# Patient Record
Sex: Female | Born: 1990 | Race: White | Hispanic: No | Marital: Married
Health system: Southern US, Community
[De-identification: ages and names within clinical notes are randomized; demographics above are authoritative.]

---

## 2020-05-02 ENCOUNTER — Telehealth (HOSPITAL_COMMUNITY): Payer: Self-pay

## 2020-05-02 NOTE — Telephone Encounter (Signed)
RN called patient after receiving a referral for the monoclonal antibodies for the treatment of COVID. Discussed with patient that due to low medication supply and high patient volumes we are currently prioritizing [place] patients and those living in a [place], [place] or [place] counties. ° °RN provided the phone number for [place] [place] [facility] for MAB clinics [phone], and website [email or web address] for additional MAB treatment centers near patient.  °

## 2021-11-02 ENCOUNTER — Other Ambulatory Visit: Payer: Self-pay | Admitting: Family Medicine

## 2021-11-02 DIAGNOSIS — N926 Irregular menstruation, unspecified: Secondary | ICD-10-CM

## 2021-11-08 ENCOUNTER — Ambulatory Visit
Admission: RE | Admit: 2021-11-08 | Discharge: 2021-11-08 | Disposition: A | Discharge status: Home / Self Care | Payer: Managed Care, Other (non HMO) | Source: Ambulatory Visit | Attending: Family Medicine | Admitting: Family Medicine

## 2021-11-08 DIAGNOSIS — N926 Irregular menstruation, unspecified: Secondary | ICD-10-CM

## 2022-01-27 DIAGNOSIS — M25562 Pain in left knee: Secondary | ICD-10-CM | POA: Diagnosis not present

## 2022-01-27 DIAGNOSIS — M7150 Other bursitis, not elsewhere classified, unspecified site: Secondary | ICD-10-CM | POA: Diagnosis not present

## 2022-01-27 DIAGNOSIS — M7989 Other specified soft tissue disorders: Secondary | ICD-10-CM | POA: Diagnosis not present

## 2022-01-27 DIAGNOSIS — W19XXXA Unspecified fall, initial encounter: Secondary | ICD-10-CM | POA: Diagnosis not present

## 2022-01-28 DIAGNOSIS — W19XXXA Unspecified fall, initial encounter: Secondary | ICD-10-CM | POA: Diagnosis not present

## 2022-01-28 DIAGNOSIS — M25562 Pain in left knee: Secondary | ICD-10-CM | POA: Diagnosis not present

## 2022-02-27 DIAGNOSIS — Z6836 Body mass index (BMI) 36.0-36.9, adult: Secondary | ICD-10-CM | POA: Diagnosis not present

## 2022-02-27 DIAGNOSIS — N926 Irregular menstruation, unspecified: Secondary | ICD-10-CM | POA: Diagnosis not present

## 2022-05-22 DIAGNOSIS — J069 Acute upper respiratory infection, unspecified: Secondary | ICD-10-CM | POA: Diagnosis not present

## 2022-05-22 DIAGNOSIS — J349 Unspecified disorder of nose and nasal sinuses: Secondary | ICD-10-CM | POA: Diagnosis not present

## 2022-08-06 DIAGNOSIS — L03112 Cellulitis of left axilla: Secondary | ICD-10-CM | POA: Diagnosis not present

## 2022-10-15 DIAGNOSIS — R070 Pain in throat: Secondary | ICD-10-CM | POA: Diagnosis not present

## 2022-10-15 DIAGNOSIS — R0981 Nasal congestion: Secondary | ICD-10-CM | POA: Diagnosis not present

## 2023-02-18 DIAGNOSIS — R350 Frequency of micturition: Secondary | ICD-10-CM | POA: Diagnosis not present

## 2023-02-18 DIAGNOSIS — R300 Dysuria: Secondary | ICD-10-CM | POA: Diagnosis not present

## 2023-03-03 DIAGNOSIS — Z6835 Body mass index (BMI) 35.0-35.9, adult: Secondary | ICD-10-CM | POA: Diagnosis not present

## 2023-03-03 DIAGNOSIS — N3000 Acute cystitis without hematuria: Secondary | ICD-10-CM | POA: Diagnosis not present

## 2023-03-18 DIAGNOSIS — N3000 Acute cystitis without hematuria: Secondary | ICD-10-CM | POA: Diagnosis not present

## 2023-03-31 DIAGNOSIS — Z2821 Immunization not carried out because of patient refusal: Secondary | ICD-10-CM | POA: Diagnosis not present

## 2023-03-31 DIAGNOSIS — Z1331 Encounter for screening for depression: Secondary | ICD-10-CM | POA: Diagnosis not present

## 2023-03-31 DIAGNOSIS — R300 Dysuria: Secondary | ICD-10-CM | POA: Diagnosis not present

## 2023-03-31 DIAGNOSIS — Z1339 Encounter for screening examination for other mental health and behavioral disorders: Secondary | ICD-10-CM | POA: Diagnosis not present

## 2023-03-31 DIAGNOSIS — N3090 Cystitis, unspecified without hematuria: Secondary | ICD-10-CM | POA: Diagnosis not present

## 2024-01-29 IMAGING — US US PELVIS COMPLETE WITH TRANSVAGINAL
1 series · 13 of 25 positions shown · non-contrast
Comparison: Abdominopelvic CT 01/02/2020

CLINICAL DATA: Abnormal menses.

EXAM:
TRANSABDOMINAL AND TRANSVAGINAL ULTRASOUND OF PELVIS
TECHNIQUE: Both transabdominal and transvaginal ultrasound examinations of the
pelvis were performed. Transabdominal technique was performed for
global imaging of the pelvis including uterus, ovaries, adnexal
regions, and pelvic cul-de-sac. It was necessary to proceed with
endovaginal exam following the transabdominal exam to visualize the
ovaries and adnexa.

[Series 1: us pelvis complete with transvaginal · 0.23mm/px · 13 of 58 slices shown]
[im 1/58]
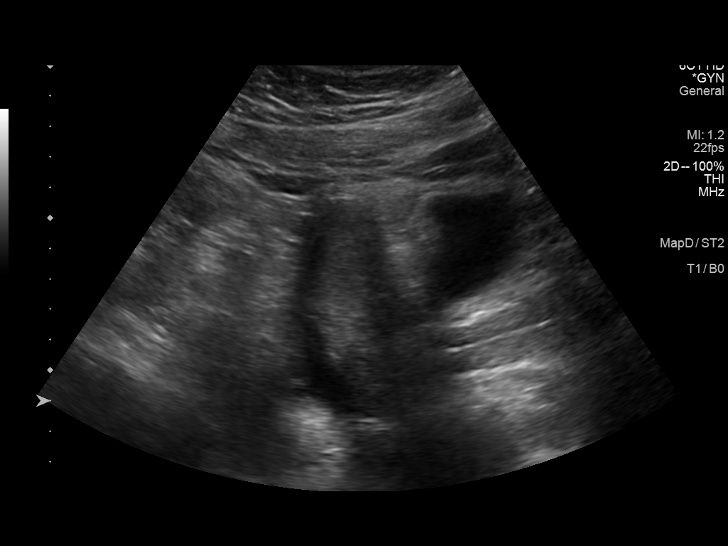
[im 5/58]
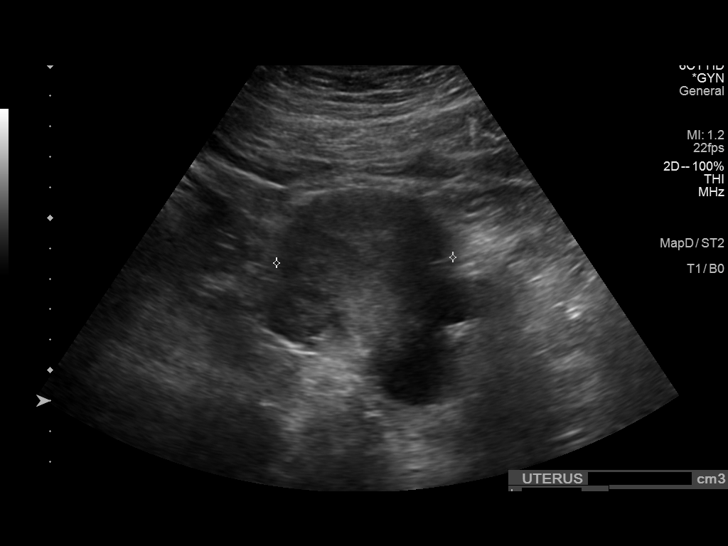
[im 10/58]
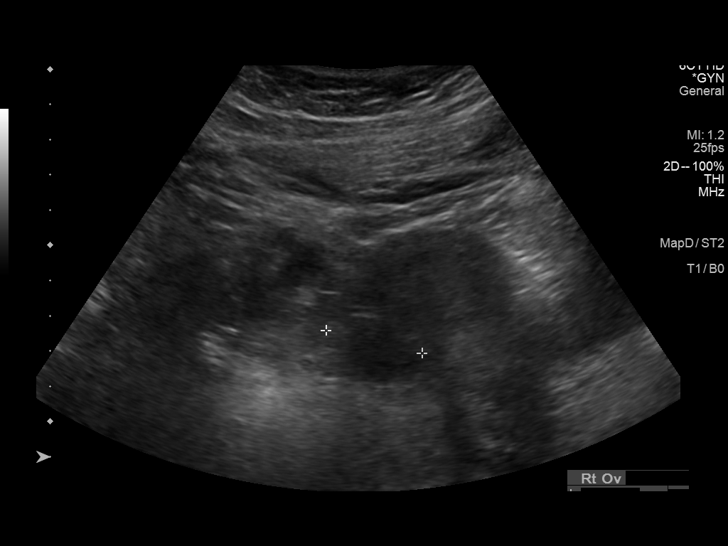
[im 15/58]
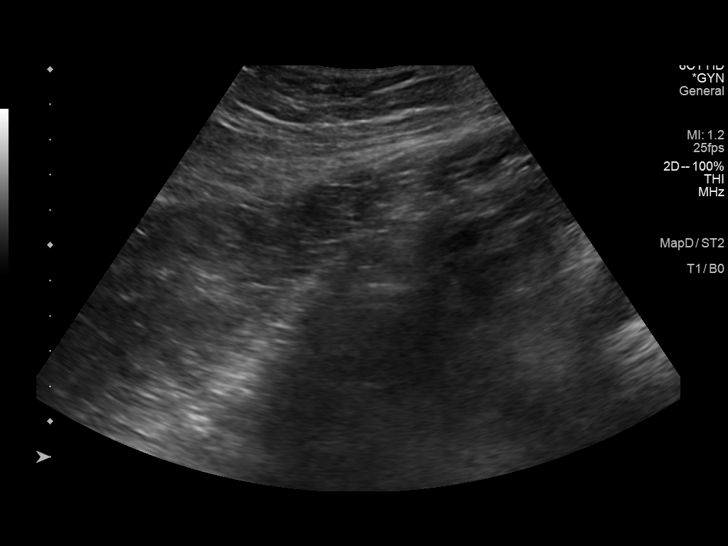
[im 20/58]
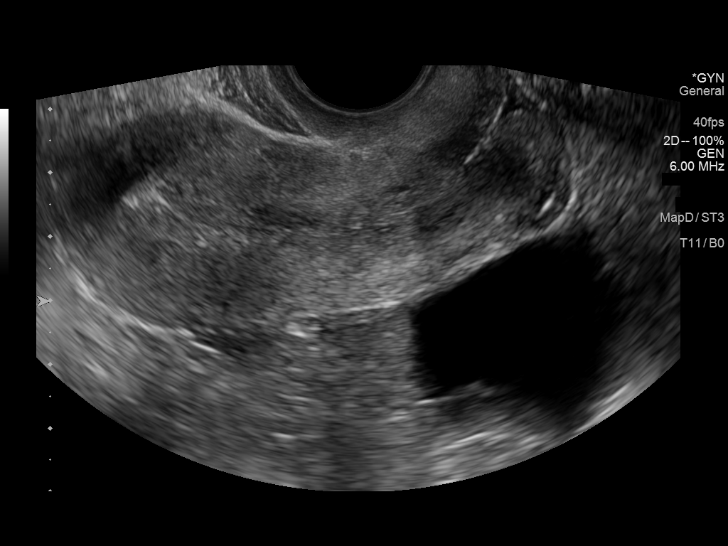
[im 24/58]
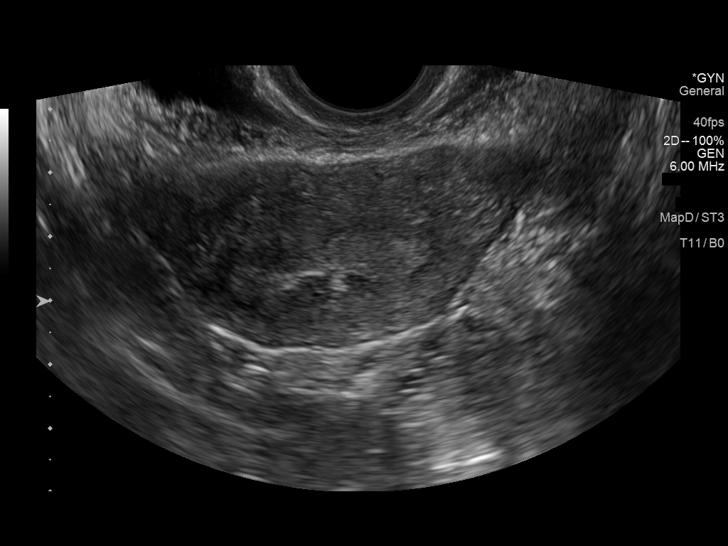
[im 29/58]
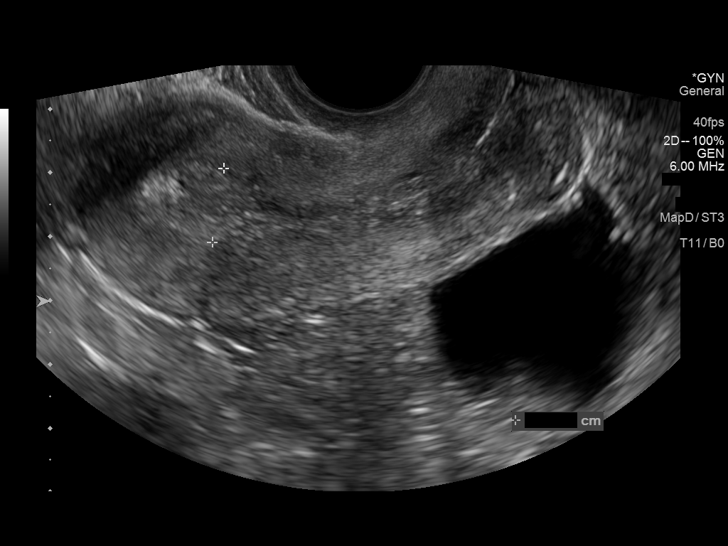
[im 34/58]
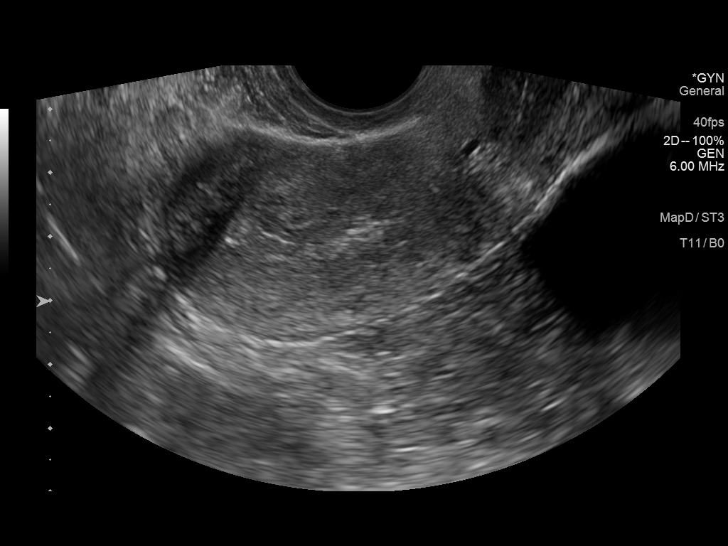
[im 39/58]
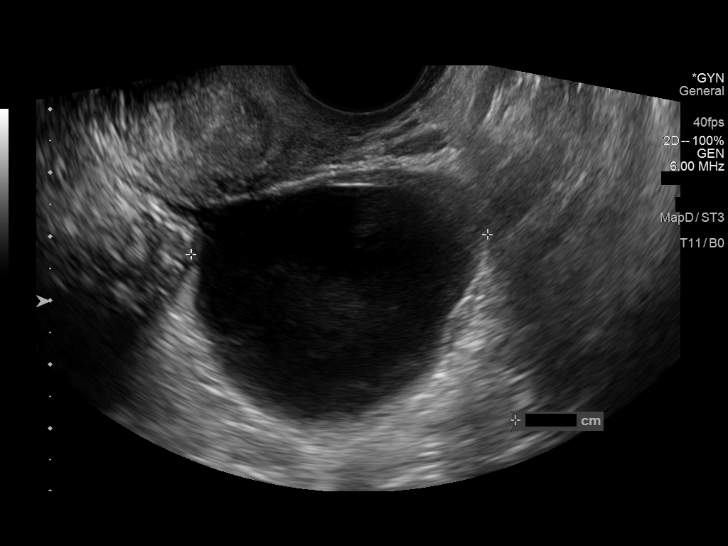
[im 43/58]
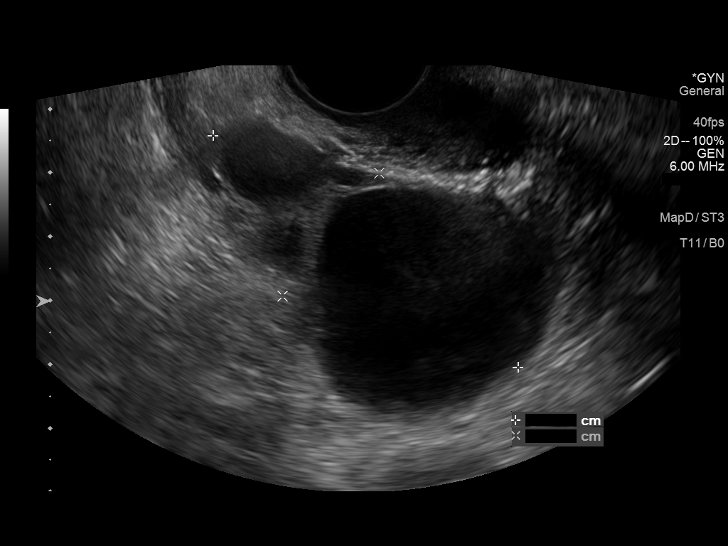
[im 48/58]
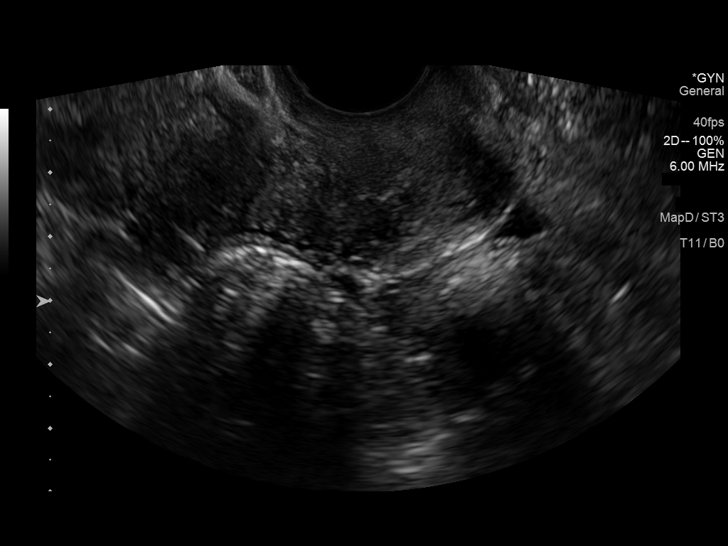
[im 53/58]
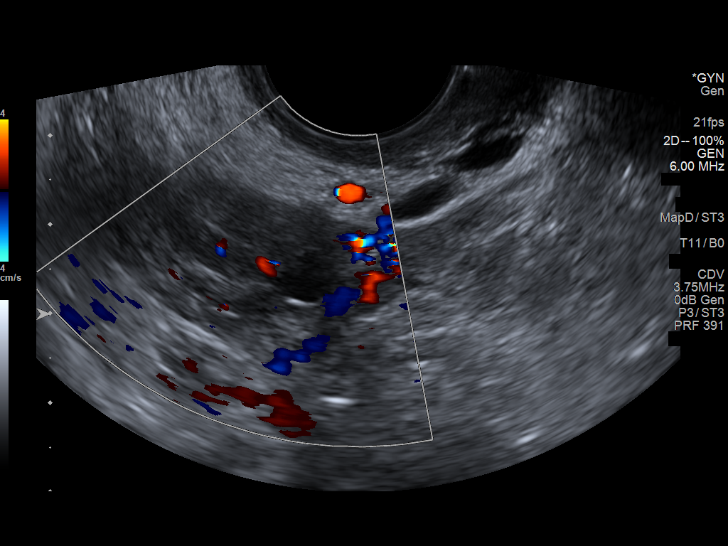
[im 58/58]
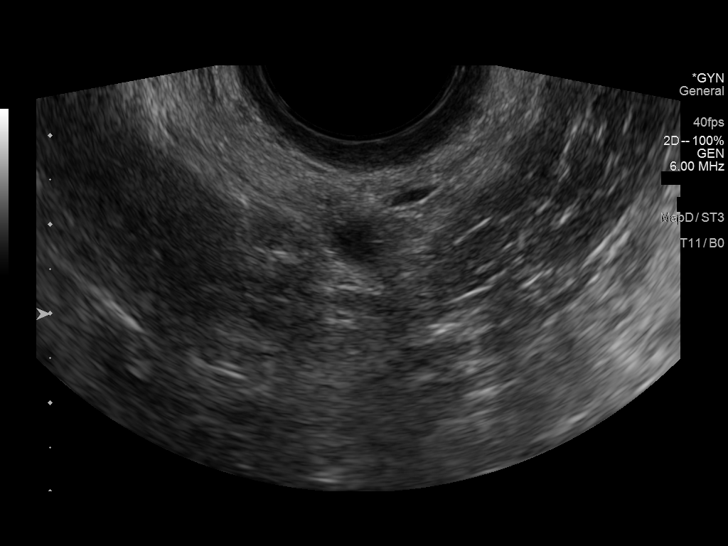

[13 of 25 positions shown; findings below may reference images not displayed]

FINDINGS: Uterus

Measurements: 7 x 3.2 x 5.8 cm = volume: 69 mL. The uterus is
anteverted. Small nabothian cyst in the cervix. No evidence of
uterine fibroid or focal lesion.

Endometrium

Thickness: 11.7 mm. Echogenic area in the fundal endometrium
measuring 4 x 6 mm may represent an area of calcification, although
nonspecific.

Right ovary

Measurements: 2.5 x 1.8 x 2. 0 cm = volume: 4.7 mL. Physiologic in
appearance. No cyst or solid lesion. Blood flow is demonstrated. No
adnexal mass.

Left ovary

Measurements: 6 x 2.5 x 4.7 cm = volume: 37 mL. There is a complex
cyst measuring 3.8 x 3.4 x 4.2 cm with low level internal echoes. 15
mm follicular cyst is also present in the ovary. Ovarian blood flow
is demonstrated.

Other findings

Trace free fluid in the pelvis.
IMPRESSION: 1. Complex left ovarian cyst measuring 4.2 cm with low level
internal echoes. This is likely a hemorrhagic cyst. For hemorrhagic
cyst of this size, no followup imaging recommended. Note: This
recommendation does not apply to premenarchal patients or to those
with increased risk (genetic, family history, elevated tumor markers
or other high-risk factors) of ovarian cancer. Reference: Radiology
[DATE]):[id number].
2. Echogenic focus in the fundal endometrium is subcentimeter, may
represent an area of calcification. This is nonspecific, may
represent sequela of prior infection or inflammation. There is no
discrete mass. In the setting of abnormal uterine bleeding, if
bleeding remains unresponsive to hormonal or medical therapy,
sonohysterogram should be considered for focal lesion work-up. (Ref:
Radiological Reasoning: Algorithmic Workup of Abnormal Vaginal
Bleeding with Endovaginal Sonography and Sonohysterography. AJR
[id number]; 191:[id number])
